# Patient Record
Sex: Female | Born: 1960 | Race: White | Hispanic: No | Marital: Married
Health system: Southern US, Community
[De-identification: ages and names within clinical notes are randomized; demographics above are authoritative.]

---

## 2013-02-25 ENCOUNTER — Other Ambulatory Visit: Payer: Self-pay | Admitting: Obstetrics & Gynecology

## 2013-02-25 DIAGNOSIS — R928 Other abnormal and inconclusive findings on diagnostic imaging of breast: Secondary | ICD-10-CM

## 2013-03-07 ENCOUNTER — Other Ambulatory Visit: Payer: Self-pay | Admitting: Obstetrics & Gynecology

## 2013-03-07 ENCOUNTER — Ambulatory Visit
Admission: RE | Admit: 2013-03-07 | Discharge: 2013-03-07 | Disposition: A | Discharge status: Home / Self Care | Payer: BC Managed Care – PPO | Source: Ambulatory Visit | Attending: Obstetrics & Gynecology | Admitting: Obstetrics & Gynecology

## 2013-03-07 DIAGNOSIS — R928 Other abnormal and inconclusive findings on diagnostic imaging of breast: Secondary | ICD-10-CM

## 2013-03-14 ENCOUNTER — Ambulatory Visit
Admission: RE | Admit: 2013-03-14 | Discharge: 2013-03-14 | Disposition: A | Discharge status: Home / Self Care | Payer: BC Managed Care – PPO | Source: Ambulatory Visit | Attending: Obstetrics & Gynecology | Admitting: Obstetrics & Gynecology

## 2013-03-14 DIAGNOSIS — R928 Other abnormal and inconclusive findings on diagnostic imaging of breast: Secondary | ICD-10-CM

## 2014-04-14 ENCOUNTER — Other Ambulatory Visit: Payer: Self-pay | Admitting: Obstetrics & Gynecology

## 2014-04-23 ENCOUNTER — Other Ambulatory Visit: Payer: Self-pay | Admitting: Obstetrics & Gynecology

## 2014-04-24 LAB — CYTOLOGY - PAP

## 2015-01-22 IMAGING — MG MM DIAGNOSTIC UNILATERAL L
2 series · 2 of 2 positions shown · non-contrast
Comparison: Multiple priors

CLINICAL DATA: Abnormal screening, left breast

EXAM:
DIGITAL DIAGNOSTIC  left MAMMOGRAM

[L CC]
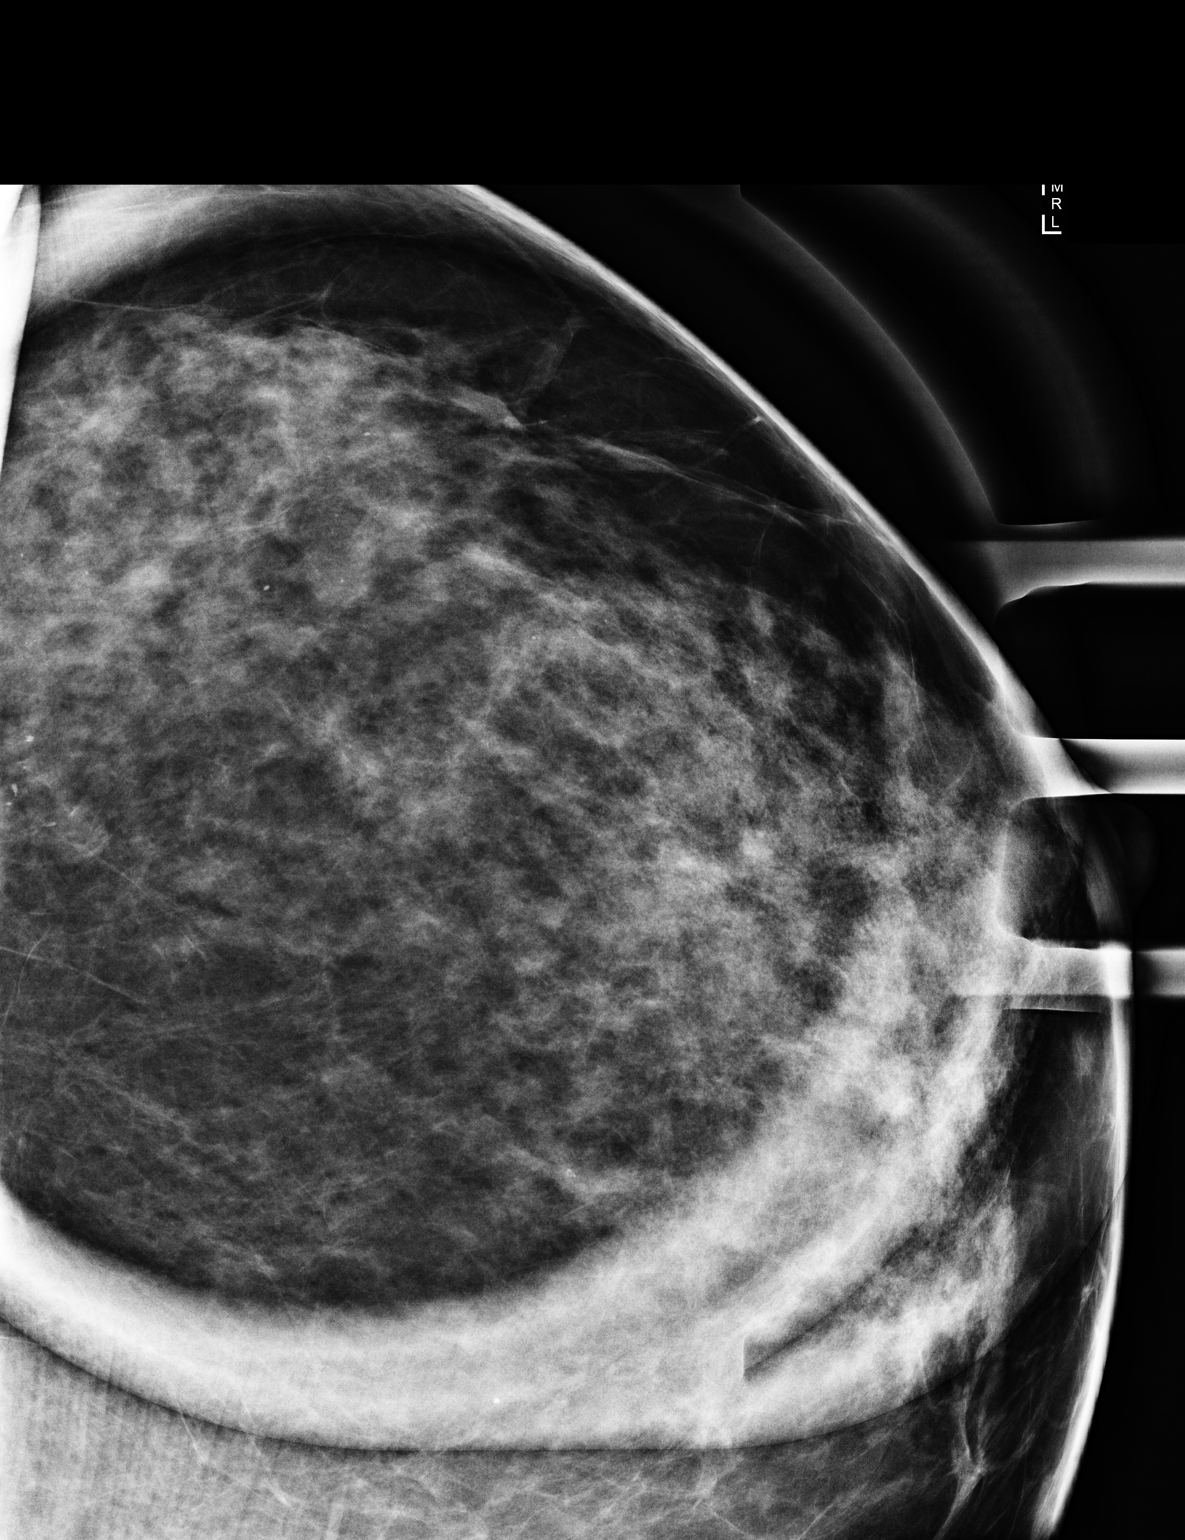

[L ML]
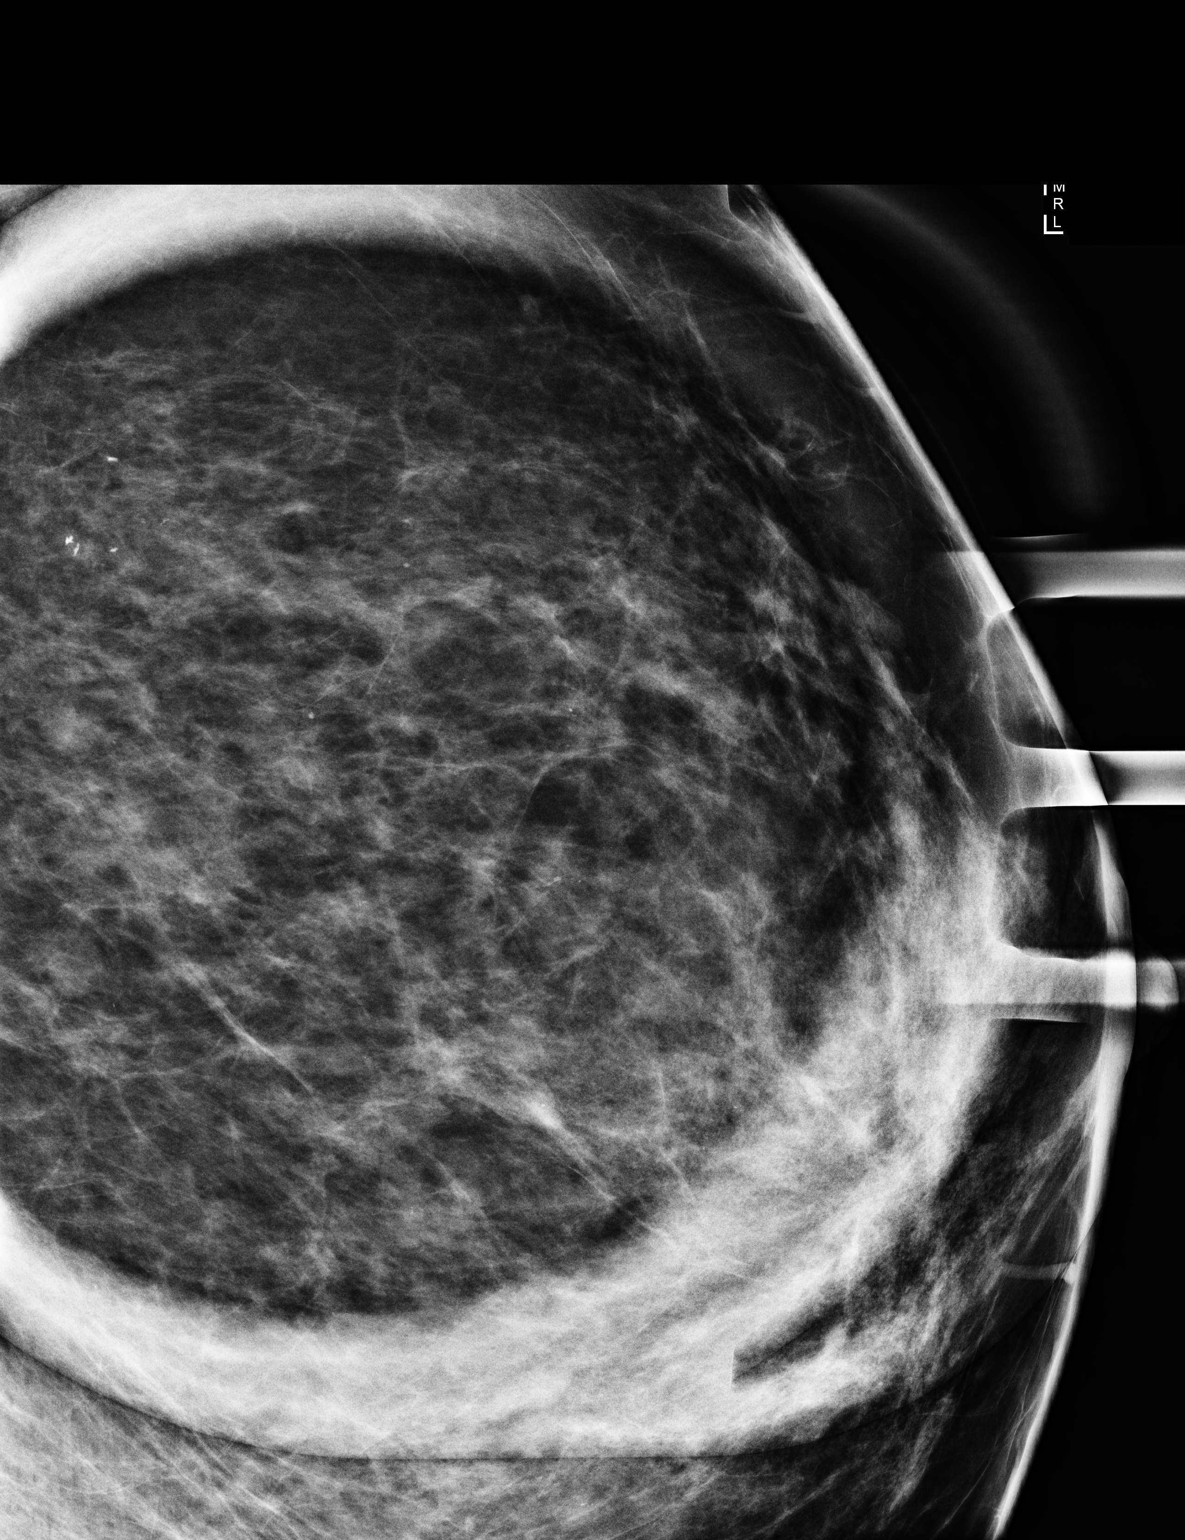

[2 of 2 positions shown; findings below may reference images not displayed]

ACR Breast Density Category b: There are scattered areas of
fibroglandular density.
FINDINGS: Spot magnification views in the upper-outer quadrant of the left
breast, posteriorly confirm the presence of a cluster of
calcifications which are pleomorphic with linear forms. The area, in
total measures 0.6 x 0.8 cm.
IMPRESSION: Calcifications, left breast.

RECOMMENDATION:
Stereotactic left breast biopsy.

I have discussed the findings and recommendations with the patient.
Results were also provided in writing at the conclusion of the
visit. If applicable, a reminder letter will be sent to the patient
regarding the next appointment.

BI-RADS CATEGORY  4: Suspicious abnormality - biopsy should be
considered.

## 2015-01-29 IMAGING — MG MM BREAST BX W LOC DEV 1ST LESION IMG BX SPEC STEREO GUIDE L
3 series · 3 of 3 positions shown · non-contrast
Comparison: Previous exams.

ADDENDUM:
Pathology revealed benign breast tissue with foci of calcified and
hyalinized fibroadenomatoid nodules and microcalcifications in the
left breast. This was found to be concordant by Dr. Nikola Saska Skenderi.
Pathology was relayed to the patient by telephone. The patient
reported doing well after the biopsy with minimal tenderness at the
site. Post biopsy instructions were reviewed and her questions were
answered. She was encouraged to call [REDACTED] for any additional concerns. She was asked to return to

Pathology results reported by Mb Bozlu Baking Classes RN, BSN on March 17, 2013.
CLINICAL DATA: Left breast calcifications.
EXAM:
LEFT STEREOTACTIC CORE NEEDLE BIOPSY

[L SPECIMEN]
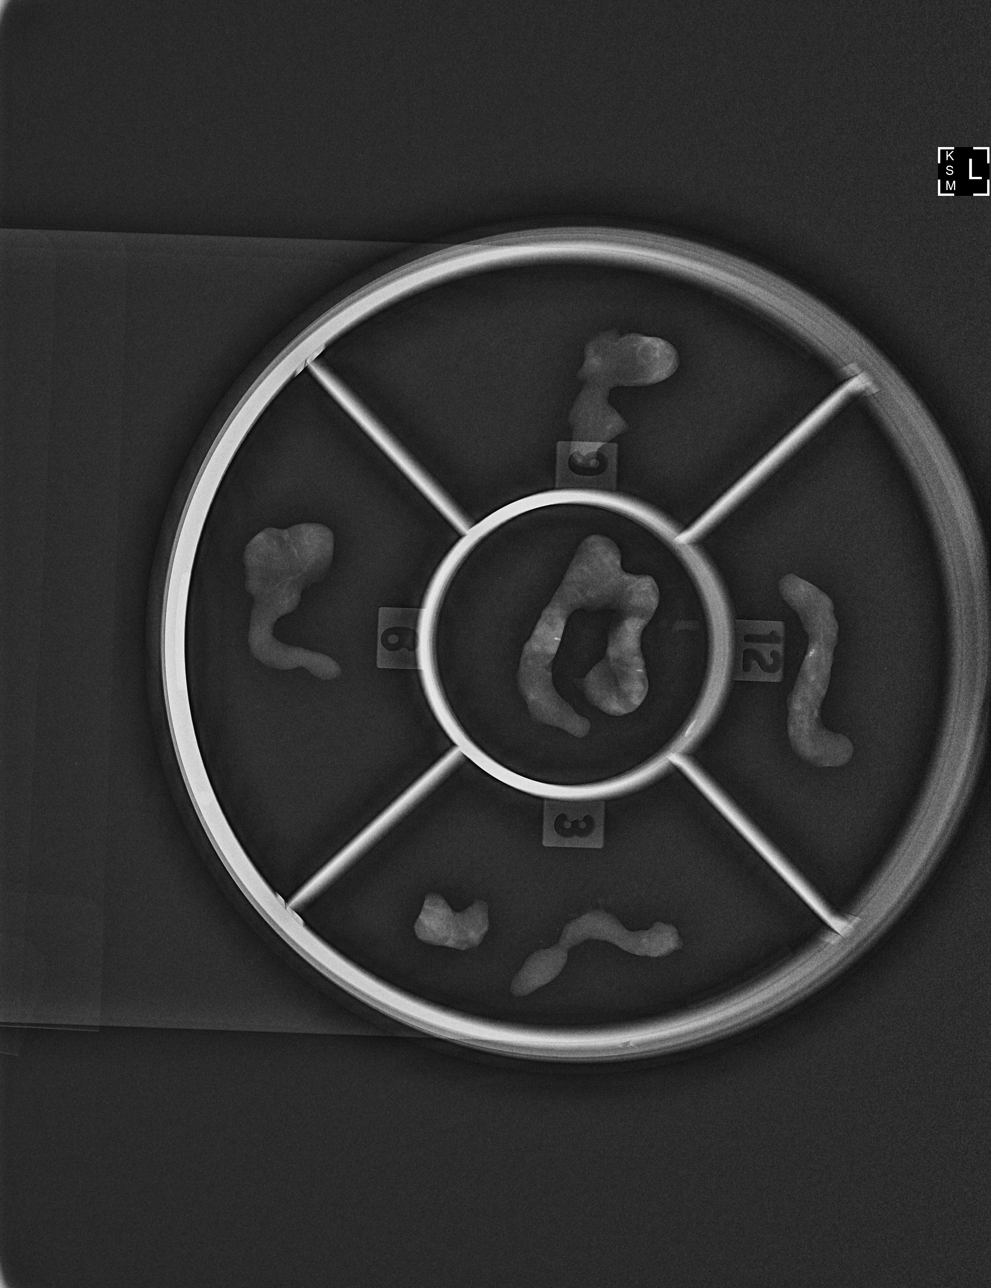

[L CC]
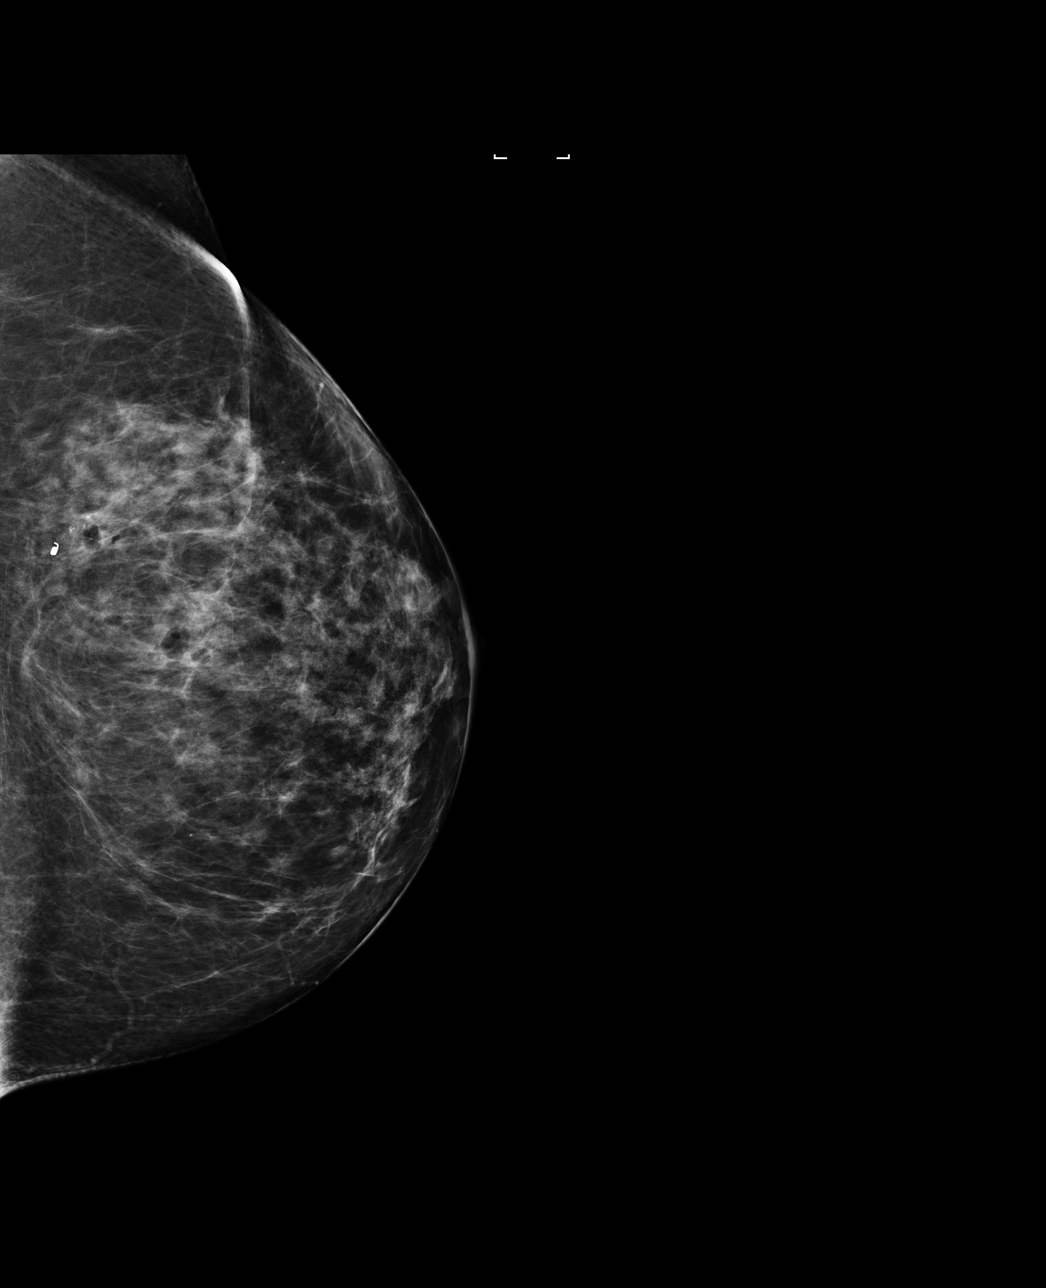

[L ML]
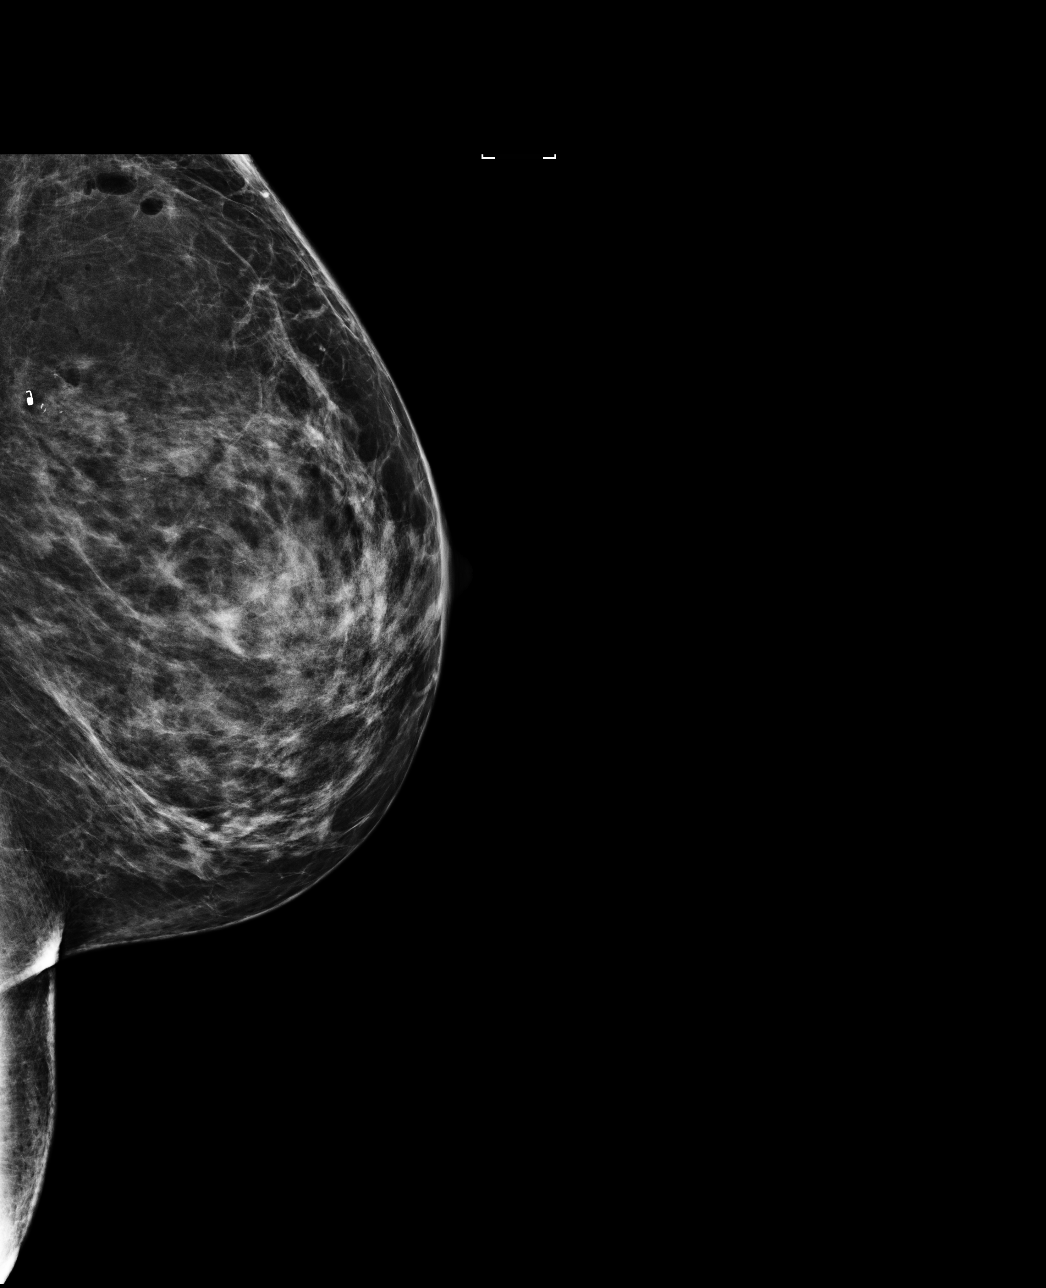

[3 of 3 positions shown; findings below may reference images not displayed]



Using sterile technique and 2% lidocaine and 2% lidocaine with
epinephrine as local anesthetic, under stereotactic guidance, a 9
gauge vacuum assisted device was used to perform core needle biopsy
of calcifications in the upper-outer quadrant of the left breast
using a superior approach. Specimen radiograph was performed showing
calcifications are present in the tissue samples. Specimens with
calcifications are identified for pathology.

At the conclusion of the procedure, a coil shaped tissue marker clip
was deployed into the biopsy cavity. Follow-up 2-view mammogram
confirmed clip placement.
IMPRESSION: Stereotactic-guided biopsy of the left breast. No apparent
complications.

## 2022-10-19 ENCOUNTER — Other Ambulatory Visit: Payer: Self-pay | Admitting: Obstetrics and Gynecology

## 2022-10-19 DIAGNOSIS — R928 Other abnormal and inconclusive findings on diagnostic imaging of breast: Secondary | ICD-10-CM

## 2022-10-30 ENCOUNTER — Other Ambulatory Visit: Payer: Self-pay | Admitting: Obstetrics and Gynecology

## 2022-10-30 ENCOUNTER — Ambulatory Visit
Admission: RE | Admit: 2022-10-30 | Discharge: 2022-10-30 | Disposition: A | Discharge status: Home / Self Care | Payer: Federal, State, Local not specified - PPO | Source: Ambulatory Visit | Attending: Obstetrics and Gynecology | Admitting: Obstetrics and Gynecology

## 2022-10-30 DIAGNOSIS — R928 Other abnormal and inconclusive findings on diagnostic imaging of breast: Secondary | ICD-10-CM

## 2022-10-30 DIAGNOSIS — N6320 Unspecified lump in the left breast, unspecified quadrant: Secondary | ICD-10-CM

## 2022-10-31 ENCOUNTER — Ambulatory Visit
Admission: RE | Admit: 2022-10-31 | Discharge: 2022-10-31 | Disposition: A | Discharge status: Home / Self Care | Payer: Federal, State, Local not specified - PPO | Source: Ambulatory Visit | Attending: Obstetrics and Gynecology | Admitting: Obstetrics and Gynecology

## 2022-10-31 DIAGNOSIS — N6320 Unspecified lump in the left breast, unspecified quadrant: Secondary | ICD-10-CM

## 2023-08-24 ENCOUNTER — Other Ambulatory Visit: Payer: Self-pay | Admitting: Obstetrics and Gynecology

## 2023-08-24 DIAGNOSIS — N644 Mastodynia: Secondary | ICD-10-CM

## 2023-08-29 ENCOUNTER — Ambulatory Visit
Admission: RE | Admit: 2023-08-29 | Discharge: 2023-08-29 | Disposition: A | Discharge status: Home / Self Care | Source: Ambulatory Visit | Attending: Obstetrics and Gynecology | Admitting: Obstetrics and Gynecology

## 2023-08-29 DIAGNOSIS — N644 Mastodynia: Secondary | ICD-10-CM
